# Patient Record
Sex: Male | Born: 1935 | Race: White | Hispanic: No | Marital: Married | Smoking: Never smoker
Health system: Southern US, Community
[De-identification: ages and names within clinical notes are randomized; demographics above are authoritative.]

## PROBLEM LIST (undated history)

## (undated) DIAGNOSIS — I639 Cerebral infarction, unspecified: Secondary | ICD-10-CM

## (undated) DIAGNOSIS — Z87442 Personal history of urinary calculi: Secondary | ICD-10-CM

## (undated) DIAGNOSIS — I10 Essential (primary) hypertension: Secondary | ICD-10-CM

## (undated) DIAGNOSIS — F419 Anxiety disorder, unspecified: Secondary | ICD-10-CM

## (undated) DIAGNOSIS — J189 Pneumonia, unspecified organism: Secondary | ICD-10-CM

## (undated) DIAGNOSIS — G4730 Sleep apnea, unspecified: Secondary | ICD-10-CM

## (undated) DIAGNOSIS — M1990 Unspecified osteoarthritis, unspecified site: Secondary | ICD-10-CM

## (undated) HISTORY — PX: OTHER SURGICAL HISTORY: SHX169

---

## 2012-03-29 DIAGNOSIS — R55 Syncope and collapse: Secondary | ICD-10-CM

## 2016-10-12 ENCOUNTER — Other Ambulatory Visit: Payer: Self-pay | Admitting: Urology

## 2016-10-13 ENCOUNTER — Other Ambulatory Visit: Payer: Self-pay | Admitting: Urology

## 2016-10-13 DIAGNOSIS — N200 Calculus of kidney: Secondary | ICD-10-CM

## 2016-11-11 ENCOUNTER — Inpatient Hospital Stay (HOSPITAL_COMMUNITY): Admission: RE | Admit: 2016-11-11 | Payer: Medicare Other | Source: Ambulatory Visit

## 2016-11-15 ENCOUNTER — Other Ambulatory Visit: Payer: Self-pay | Admitting: Radiology

## 2016-11-15 NOTE — Patient Instructions (Signed)
Howard Cabrera  11/15/2016   Your procedure is scheduled on: 11/18/2016    Report to [facility]  Entrance [place]  elevators to 3rd floor to  Short Stay Center at   0730 AM.  Call this number if you have problems the morning of surgery [phone]   Remember: ONLY 1 PERSON MAY GO WITH YOU TO SHORT STAY TO GET  READY MORNING OF YOUR SURGERY.  Do not eat food or drink liquids :After Midnight.     Take these medicines the morning of surgery with A SIP OF WATER: Diltiazem ( Dilacor)                                You may not have any metal on your body including hair pins and              piercings  Do not wear jewelry,  lotions, powders or perfumes, deodorant                   Men may shave face and neck.   Do not bring valuables to the hospital. [facility] IS NOT             RESPONSIBLE   FOR VALUABLES.  Contacts, dentures or bridgework may not be worn into surgery.  Leave suitcase in the car. After surgery it may be brought to your room.                   Please read over the following fact sheets you were given: _____________________________________________________________________             [facility] - Preparing for Surgery Before surgery, you can play an important role.  Because skin is not sterile, your skin needs to be as free of germs as possible.  You can reduce the number of germs on your skin by washing with CHG (chlorahexidine gluconate) soap before surgery.  CHG is an antiseptic cleaner which kills germs and bonds with the skin to continue killing germs even after washing. Please DO NOT use if you have an allergy to CHG or antibacterial soaps.  If your skin becomes reddened/irritated stop using the CHG and inform your nurse when you arrive at Short Stay. Do not shave (including legs and underarms) for at least 48 hours prior to the first CHG shower.  You may shave your face/neck. Please follow these instructions carefully:  1.   Shower with CHG Soap the night before surgery and the  morning of Surgery.  2.  If you choose to wash your hair, wash your hair first as usual with your  normal  shampoo.  3.  After you shampoo, rinse your hair and body thoroughly to remove the  shampoo.                           4.  Use CHG as you would any other liquid soap.  You can apply chg directly  to the skin and wash                       Gently with a scrungie or clean washcloth.  5.  Apply the CHG Soap to your body ONLY FROM THE NECK DOWN.   Do not use on face/ open  Wound or open sores. Avoid contact with eyes, ears mouth and genitals (private parts).                       Wash face,  Genitals (private parts) with your normal soap.             6.  Wash thoroughly, paying special attention to the area where your surgery  will be performed.  7.  Thoroughly rinse your body with warm water from the neck down.  8.  DO NOT shower/wash with your normal soap after using and rinsing off  the CHG Soap.                9.  Pat yourself dry with a clean towel.            10.  Wear clean pajamas.            11.  Place clean sheets on your bed the night of your first shower and do not  sleep with pets. Day of Surgery : Do not apply any lotions/deodorants the morning of surgery.  Please wear clean clothes to the hospital/surgery center.  FAILURE TO FOLLOW THESE INSTRUCTIONS MAY RESULT IN THE CANCELLATION OF YOUR SURGERY PATIENT SIGNATURE_________________________________  NURSE SIGNATURE__________________________________  ________________________________________________________________________  WHAT IS A BLOOD TRANSFUSION? Blood Transfusion Information  A transfusion is the replacement of blood or some of its parts. Blood is made up of multiple cells which provide different functions.  Red blood cells carry oxygen and are used for blood loss replacement.  White blood cells fight against infection.  Platelets control  bleeding.  Plasma helps clot blood.  Other blood products are available for specialized needs, such as hemophilia or other clotting disorders. BEFORE THE TRANSFUSION  Who gives blood for transfusions?   Healthy volunteers who are fully evaluated to make sure their blood is safe. This is blood bank blood. Transfusion therapy is the safest it has ever been in the practice of medicine. Before blood is taken from a donor, a complete history is taken to make sure that person has no history of diseases nor engages in risky social behavior (examples are intravenous drug use or sexual activity with multiple partners). The donor's travel history is screened to minimize risk of transmitting infections, such as malaria. The donated blood is tested for signs of infectious diseases, such as HIV and hepatitis. The blood is then tested to be sure it is compatible with you in order to minimize the chance of a transfusion reaction. If you or a relative donates blood, this is often done in anticipation of surgery and is not appropriate for emergency situations. It takes many days to process the donated blood. RISKS AND COMPLICATIONS Although transfusion therapy is very safe and saves many lives, the main dangers of transfusion include:   Getting an infectious disease.  Developing a transfusion reaction. This is an allergic reaction to something in the blood you were given. Every precaution is taken to prevent this. The decision to have a blood transfusion has been considered carefully by your caregiver before blood is given. Blood is not given unless the benefits outweigh the risks. AFTER THE TRANSFUSION  Right after receiving a blood transfusion, you will usually feel much better and more energetic. This is especially true if your red blood cells have gotten low (anemic). The transfusion raises the level of the red blood cells which carry oxygen, and this usually causes an energy increase.  The  nurse  administering the transfusion will monitor you carefully for complications. HOME CARE INSTRUCTIONS  No special instructions are needed after a transfusion. You may find your energy is better. Speak with your caregiver about any limitations on activity for underlying diseases you may have. SEEK MEDICAL CARE IF:   Your condition is not improving after your transfusion.  You develop redness or irritation at the intravenous (IV) site. SEEK IMMEDIATE MEDICAL CARE IF:  Any of the following symptoms occur over the next 12 hours:  Shaking chills.  You have a temperature by mouth above 102 F (38.9 C), not controlled by medicine.  Chest, back, or muscle pain.  People around you feel you are not acting correctly or are confused.  Shortness of breath or difficulty breathing.  Dizziness and fainting.  You get a rash or develop hives.  You have a decrease in urine output.  Your urine turns a dark color or changes to pink, red, or brown. Any of the following symptoms occur over the next 10 days:  You have a temperature by mouth above 102 F (38.9 C), not controlled by medicine.  Shortness of breath.  Weakness after normal activity.  The white part of the eye turns yellow (jaundice).  You have a decrease in the amount of urine or are urinating less often.  Your urine turns a dark color or changes to pink, red, or brown. Document Released: 10/22/2000 Document Revised: 01/17/2012 Document Reviewed: 06/10/2008 [facility] Patient Information 2014 [place], [place].  _______________________________________________________________________

## 2016-11-16 ENCOUNTER — Encounter (HOSPITAL_COMMUNITY)
Admission: RE | Admit: 2016-11-16 | Discharge: 2016-11-16 | Disposition: A | Discharge status: Home / Self Care | Payer: Medicare Other | Source: Ambulatory Visit | Attending: Urology | Admitting: Urology

## 2016-11-16 ENCOUNTER — Ambulatory Visit (HOSPITAL_COMMUNITY)
Admission: RE | Admit: 2016-11-16 | Discharge: 2016-11-16 | Disposition: A | Discharge status: Home / Self Care | Payer: Medicare Other | Source: Ambulatory Visit | Attending: Anesthesiology | Admitting: Anesthesiology

## 2016-11-16 ENCOUNTER — Encounter (HOSPITAL_COMMUNITY): Payer: Self-pay

## 2016-11-16 DIAGNOSIS — N200 Calculus of kidney: Secondary | ICD-10-CM | POA: Insufficient documentation

## 2016-11-16 DIAGNOSIS — J9811 Atelectasis: Secondary | ICD-10-CM | POA: Diagnosis not present

## 2016-11-16 DIAGNOSIS — Z01818 Encounter for other preprocedural examination: Secondary | ICD-10-CM

## 2016-11-16 HISTORY — DX: Sleep apnea, unspecified: G47.30

## 2016-11-16 HISTORY — DX: Unspecified osteoarthritis, unspecified site: M19.90

## 2016-11-16 HISTORY — DX: Pneumonia, unspecified organism: J18.9

## 2016-11-16 HISTORY — DX: Anxiety disorder, unspecified: F41.9

## 2016-11-16 HISTORY — DX: Personal history of urinary calculi: Z87.442

## 2016-11-16 HISTORY — DX: Cerebral infarction, unspecified: I63.9

## 2016-11-16 HISTORY — DX: Essential (primary) hypertension: I10

## 2016-11-16 LAB — BASIC METABOLIC PANEL
Anion gap: 8 (ref 5–15)
BUN: 14 mg/dL (ref 6–20)
CHLORIDE: 104 mmol/L (ref 101–111)
CO2: 27 mmol/L (ref 22–32)
CREATININE: 1.17 mg/dL (ref 0.61–1.24)
Calcium: 9.3 mg/dL (ref 8.9–10.3)
GFR calc Af Amer: >60 mL/min (ref >60)
GFR calc non Af Amer: 56 mL/min — ABNORMAL LOW (ref >60)
Glucose, Bld: 100 mg/dL — ABNORMAL HIGH (ref 65–99)
Potassium: 3.5 mmol/L (ref 3.5–5.1)
SODIUM: 139 mmol/L (ref 135–145)

## 2016-11-16 LAB — PROTIME-INR
INR: 1
Prothrombin Time: 13.2 seconds (ref 11.4–15.2)

## 2016-11-16 LAB — CBC
HCT: 48.6 % (ref 39.0–52.0)
Hemoglobin: 17.3 g/dL — ABNORMAL HIGH (ref 13.0–17.0)
MCH: 34.9 pg — AB (ref 26.0–34.0)
MCHC: 35.6 g/dL (ref 30.0–36.0)
MCV: 98 fL (ref 78.0–100.0)
PLATELETS: 214 10*3/uL (ref 150–400)
RBC: 4.96 MIL/uL (ref 4.22–5.81)
RDW: 13 % (ref 11.5–15.5)
WBC: 6 10*3/uL (ref 4.0–10.5)

## 2016-11-16 LAB — ABO/RH: ABO/RH(D): O POS

## 2016-11-16 NOTE — Final Progress Note (Signed)
Final CXR done 11/16/16 results shown to anesthesia ( Dr Noreene LarssonJoslin) .  No new orders given.

## 2016-11-16 NOTE — Progress Notes (Signed)
Final EKG 11/16/16 in epic, CXR done 11/16/16 routed via epic to Dr. Retta Dionesahlstedt

## 2016-11-17 NOTE — H&P (Signed)
Urology History and Physical Exam  CC: Left kidney stone  HPI: 83 year old male originally sent here by Dr. Nechama GuardBauer in [place] for evaluation and management of a branched left renal calculus. He had a CT of the chest abdomen and pelvis during a recent hospitalization for pneumonia. He was found to have asymptomatic right renal calyceal and a left staghorn stone. The right renal stone was recently treated with lithotripsy. He tolerated this procedure well. There is a remaining 26 mm branch left renal stone.   The patient has been prepared by Dr. Nechama GuardBauer to have a possible percutaneous nephrolithotomy. He has been instructed in the procedure, risks and complications.  He is not having any significant gross hematuria or white pain. He does have a long-standing history of urolithiasis. His urologist in the past has been Dr. Ian BushmanHurt in [place].     PMH: Past Medical History:  Diagnosis Date  . Anxiety   . Arthritis   . History of kidney stones   . Hypertension   . Pneumonia    08/2016  . Sleep apnea    cpap  . Stroke [facility](HCC)    mild stroke pt. state" just cant remember"    PSH: Past Surgical History:  Procedure Laterality Date  . Left CEA    . Left thumb surgery      Allergies: Allergies  Allergen Reactions  . Penicillins Rash    Has patient had a PCN reaction causing immediate rash, facial/tongue/throat swelling, SOB or lightheadedness with hypotension: Yes Has patient had a PCN reaction causing severe rash involving mucus membranes or skin necrosis: No Has patient had a PCN reaction that required hospitalization No Has patient had a PCN reaction occurring within the last 10 years: No If all of the above answers are "NO", then may proceed with Cephalosporin use.     Medications: No prescriptions prior to admission.     Social History: Social History   Social History  . Marital status: Married    Spouse name: N/A  . Number of children: N/A  . Years of  education: N/A   Occupational History  . Not on file.   Social History Main Topics  . Smoking status: Never Smoker  . Smokeless tobacco: Never Used  . Alcohol use No  . Drug use: No  . Sexual activity: Not Currently   Other Topics Concern  . Not on file   Social History Narrative  . No narrative on file    Family History: No family history on file.  Review of Systems: GU Review Male: Patient denies frequent urination, hard to postpone urination, burning/ pain with urination, get up at night to urinate, leakage of urine, stream starts and stops, trouble starting your stream, have to strain to urinate , erection problems, and penile pain. Gastrointestinal (Upper): Patient denies nausea, vomiting, and indigestion/ heartburn. Gastrointestinal (Lower): Patient denies constipation and diarrhea. Constitutional: Patient denies fever, night sweats, weight loss, and fatigue. Skin: Patient denies skin rash/ lesion and itching. Eyes: Patient denies blurred vision and double vision. Ears/ Nose/ Throat: Patient denies sore throat and sinus problems. Hematologic/Lymphatic: Patient reports easy bruising. Patient denies swollen glands. Cardiovascular: Patient denies leg swelling and chest pains. Respiratory: Patient denies cough and shortness of breath. Endocrine: Patient denies excessive thirst. Musculoskeletal: Patient denies back pain and joint pain. Neurological: Patient denies headaches and dizziness. Psychologic: Patient denies depression and anxiety.                   Physical  Exam: @VITALS2 @ Constitutional: Well-nourished. No physical deformities. Normally developed. Good grooming. Neck: Neck symmetrical, not swollen. Normal tracheal position. Respiratory: No labored breathing, no use of accessory muscles. Skin: No paleness, no jaundice, no cyanosis. No lesion, no ulcer, no rash. Neurologic / Psychiatric: Oriented to time, oriented to place, oriented to person. No depression, no anxiety, no  agitation. Eyes: Normal conjunctivae. Normal eyelids. Ears, Nose, Mouth, and Throat: Left ear no scars, no lesions, no masses. Right ear no scars, no lesions, no masses. Nose no scars, no lesions, no masses. Normal hearing. Normal lips. Musculoskeletal: Normal gait and station of head and neck.   Studies:  Recent Labs     11/16/16  1343  HGB  17.3*  WBC  6.0  PLT  214    Recent Labs     11/16/16  1343  NA  139  K  3.5  CL  104  CO2  27  BUN  14  CREATININE  1.17  CALCIUM  9.3  GFRNONAA  56*  GFRAA  >60     Recent Labs     11/16/16  1400  INR  1.00     Invalid input(s): ABG    Assessment:  26 mm branched left renal stone  Plan: Left PCNL. I have discussed the procedure as well as risks and complications (bleeding/transfusion need, infection, anesthetic complications, death, 2nd procedure, among others) and the patient desires to proceed

## 2016-11-17 NOTE — Progress Notes (Signed)
Patient's wife aware to go to Radiology first and check in on 11/18/2016 then go to Short Stay. Wife voiced understanding   Spoke with wife at 11/17/16 at 1615pm.

## 2016-11-18 ENCOUNTER — Encounter (HOSPITAL_COMMUNITY): Payer: Self-pay

## 2016-11-18 ENCOUNTER — Ambulatory Visit (HOSPITAL_COMMUNITY): Payer: Medicare Other | Admitting: Anesthesiology

## 2016-11-18 ENCOUNTER — Observation Stay (HOSPITAL_COMMUNITY): Payer: Medicare Other

## 2016-11-18 ENCOUNTER — Ambulatory Visit (HOSPITAL_COMMUNITY)
Admission: RE | Admit: 2016-11-18 | Discharge: 2016-11-18 | Disposition: A | Discharge status: Home / Self Care | Payer: Medicare Other | Source: Ambulatory Visit | Attending: Urology | Admitting: Urology

## 2016-11-18 ENCOUNTER — Encounter (HOSPITAL_COMMUNITY)
Admission: RE | Disposition: A | Discharge status: Home / Self Care | Payer: Self-pay | Source: Ambulatory Visit | Attending: Urology

## 2016-11-18 ENCOUNTER — Observation Stay (HOSPITAL_COMMUNITY)
Admission: RE | Admit: 2016-11-18 | Discharge: 2016-11-19 | Disposition: A | Discharge status: Home / Self Care | Payer: Medicare Other | Source: Ambulatory Visit | Attending: Urology | Admitting: Urology

## 2016-11-18 DIAGNOSIS — Z7982 Long term (current) use of aspirin: Secondary | ICD-10-CM | POA: Diagnosis not present

## 2016-11-18 DIAGNOSIS — I10 Essential (primary) hypertension: Secondary | ICD-10-CM | POA: Diagnosis not present

## 2016-11-18 DIAGNOSIS — Z79899 Other long term (current) drug therapy: Secondary | ICD-10-CM | POA: Diagnosis not present

## 2016-11-18 DIAGNOSIS — G4730 Sleep apnea, unspecified: Secondary | ICD-10-CM | POA: Insufficient documentation

## 2016-11-18 DIAGNOSIS — Z9989 Dependence on other enabling machines and devices: Secondary | ICD-10-CM | POA: Insufficient documentation

## 2016-11-18 DIAGNOSIS — Z8673 Personal history of transient ischemic attack (TIA), and cerebral infarction without residual deficits: Secondary | ICD-10-CM | POA: Diagnosis not present

## 2016-11-18 DIAGNOSIS — N200 Calculus of kidney: Principal | ICD-10-CM

## 2016-11-18 HISTORY — PX: IR GENERIC HISTORICAL: IMG1180011

## 2016-11-18 HISTORY — PX: NEPHROLITHOTOMY: SHX5134

## 2016-11-18 LAB — TYPE AND SCREEN
ABO/RH(D): O POS
ANTIBODY SCREEN: NEGATIVE

## 2016-11-18 SURGERY — NEPHROLITHOTOMY PERCUTANEOUS
Anesthesia: General | Laterality: Left

## 2016-11-18 MED ORDER — SUCCINYLCHOLINE CHLORIDE 200 MG/10ML IV SOSY
PREFILLED_SYRINGE | INTRAVENOUS | Status: AC
  Filled 2016-11-18: qty 10

## 2016-11-18 MED ORDER — IOPAMIDOL (ISOVUE-300) INJECTION 61%
INTRAVENOUS | Status: AC
  Filled 2016-11-18: qty 50

## 2016-11-18 MED ORDER — DEXAMETHASONE SODIUM PHOSPHATE 10 MG/ML IJ SOLN
INTRAMUSCULAR | Status: AC
  Filled 2016-11-18: qty 1

## 2016-11-18 MED ORDER — LIDOCAINE HCL 1 % IJ SOLN
INTRAMUSCULAR | Status: AC
  Filled 2016-11-18: qty 20

## 2016-11-18 MED ORDER — SODIUM CHLORIDE 0.9 % IR SOLN
Status: DC | PRN
  Administered 2016-11-18: 12000 mL

## 2016-11-18 MED ORDER — DIPHENHYDRAMINE HCL 25 MG PO CAPS: 25.0000 mg | ORAL_CAPSULE | Freq: Every day | ORAL | Status: DC | PRN

## 2016-11-18 MED ORDER — SIMVASTATIN 40 MG PO TABS: 40.0000 mg | ORAL_TABLET | Freq: Every evening | ORAL | Status: DC

## 2016-11-18 MED ORDER — DILTIAZEM HCL ER 240 MG PO CP24
240.0000 mg | ORAL_CAPSULE | Freq: Every day | ORAL | Status: DC
  Administered 2016-11-19: 240 mg via ORAL
  Filled 2016-11-18 (×2): qty 1

## 2016-11-18 MED ORDER — ROCURONIUM BROMIDE 50 MG/5ML IV SOSY
PREFILLED_SYRINGE | INTRAVENOUS | Status: AC
  Filled 2016-11-18: qty 5

## 2016-11-18 MED ORDER — SUGAMMADEX SODIUM 200 MG/2ML IV SOLN
INTRAVENOUS | Status: DC | PRN
  Administered 2016-11-18: 200 mg via INTRAVENOUS

## 2016-11-18 MED ORDER — IOPAMIDOL (ISOVUE-300) INJECTION 61%
INTRAVENOUS | Status: AC
  Administered 2016-11-18: 60 mL
  Filled 2016-11-18: qty 50

## 2016-11-18 MED ORDER — ONDANSETRON HCL 4 MG/2ML IJ SOLN
INTRAMUSCULAR | Status: AC
  Filled 2016-11-18: qty 2

## 2016-11-18 MED ORDER — OXYBUTYNIN CHLORIDE 5 MG PO TABS: 5.0000 mg | ORAL_TABLET | Freq: Three times a day (TID) | ORAL | Status: DC | PRN

## 2016-11-18 MED ORDER — ROSUVASTATIN CALCIUM 10 MG PO TABS
10.0000 mg | ORAL_TABLET | Freq: Every day | ORAL | Status: DC
  Administered 2016-11-18: 10 mg via ORAL
  Filled 2016-11-18: qty 1

## 2016-11-18 MED ORDER — SODIUM CHLORIDE 0.9 % IV SOLN
INTRAVENOUS | Status: DC
Start: 2016-11-18 — End: 2016-11-18
  Administered 2016-11-18: 08:00:00 via INTRAVENOUS

## 2016-11-18 MED ORDER — CIPROFLOXACIN IN D5W 400 MG/200ML IV SOLN
400.0000 mg | INTRAVENOUS | Status: AC
Start: 2016-11-18 — End: 2016-11-18
  Administered 2016-11-18: 400 mg via INTRAVENOUS
  Filled 2016-11-18: qty 200

## 2016-11-18 MED ORDER — DEXAMETHASONE SODIUM PHOSPHATE 10 MG/ML IJ SOLN
INTRAMUSCULAR | Status: DC | PRN
  Administered 2016-11-18: 10 mg via INTRAVENOUS

## 2016-11-18 MED ORDER — FENTANYL CITRATE (PF) 100 MCG/2ML IJ SOLN
INTRAMUSCULAR | Status: AC
  Filled 2016-11-18: qty 2

## 2016-11-18 MED ORDER — MEPERIDINE HCL 50 MG/ML IJ SOLN: 6.2500 mg | INTRAMUSCULAR | Status: DC | PRN

## 2016-11-18 MED ORDER — LIDOCAINE HCL 1 % IJ SOLN
INTRAMUSCULAR | Status: AC | PRN
  Administered 2016-11-18: 10 mL

## 2016-11-18 MED ORDER — SODIUM CHLORIDE 0.45 % IV SOLN
INTRAVENOUS | Status: DC
  Administered 2016-11-18: 18:00:00 via INTRAVENOUS

## 2016-11-18 MED ORDER — SULFAMETHOXAZOLE-TRIMETHOPRIM 800-160 MG PO TABS: 1.0000 | ORAL_TABLET | Freq: Two times a day (BID) | ORAL | 0 refills | Status: AC

## 2016-11-18 MED ORDER — LIDOCAINE 2% (20 MG/ML) 5 ML SYRINGE
INTRAMUSCULAR | Status: AC
  Filled 2016-11-18: qty 5

## 2016-11-18 MED ORDER — PHENYLEPHRINE HCL 10 MG/ML IJ SOLN
INTRAMUSCULAR | Status: AC
  Filled 2016-11-18: qty 1

## 2016-11-18 MED ORDER — SULFAMETHOXAZOLE-TRIMETHOPRIM 800-160 MG PO TABS
1.0000 | ORAL_TABLET | Freq: Two times a day (BID) | ORAL | Status: DC
  Administered 2016-11-18 – 2016-11-19 (×2): 1 via ORAL
  Filled 2016-11-18 (×3): qty 1

## 2016-11-18 MED ORDER — NALOXONE HCL 0.4 MG/ML IJ SOLN
INTRAMUSCULAR | Status: AC
  Filled 2016-11-18: qty 1

## 2016-11-18 MED ORDER — SUCCINYLCHOLINE CHLORIDE 200 MG/10ML IV SOSY
PREFILLED_SYRINGE | INTRAVENOUS | Status: DC | PRN
  Administered 2016-11-18: 120 mg via INTRAVENOUS

## 2016-11-18 MED ORDER — FENTANYL CITRATE (PF) 100 MCG/2ML IJ SOLN
25.0000 ug | INTRAMUSCULAR | Status: DC | PRN
  Administered 2016-11-18: 25 ug via INTRAVENOUS
  Administered 2016-11-18: 50 ug via INTRAVENOUS

## 2016-11-18 MED ORDER — ONDANSETRON HCL 4 MG/2ML IJ SOLN
INTRAMUSCULAR | Status: DC | PRN
Start: 2016-11-18 — End: 2016-11-18
  Administered 2016-11-18: 4 mg via INTRAVENOUS

## 2016-11-18 MED ORDER — PROPOFOL 10 MG/ML IV BOLUS
INTRAVENOUS | Status: DC | PRN
  Administered 2016-11-18: 140 mg via INTRAVENOUS

## 2016-11-18 MED ORDER — ACETAMINOPHEN 325 MG PO TABS: 650.0000 mg | ORAL_TABLET | ORAL | Status: DC | PRN

## 2016-11-18 MED ORDER — PROPOFOL 10 MG/ML IV BOLUS
INTRAVENOUS | Status: AC
  Filled 2016-11-18: qty 20

## 2016-11-18 MED ORDER — LACTATED RINGERS IV SOLN
INTRAVENOUS | Status: DC | PRN
  Administered 2016-11-18: 13:00:00 via INTRAVENOUS

## 2016-11-18 MED ORDER — OXYCODONE HCL 5 MG PO TABS
5.0000 mg | ORAL_TABLET | ORAL | Status: DC | PRN
  Administered 2016-11-18: 5 mg via ORAL
  Filled 2016-11-18: qty 1

## 2016-11-18 MED ORDER — SENNA 8.6 MG PO TABS
1.0000 | ORAL_TABLET | Freq: Two times a day (BID) | ORAL | Status: DC
  Administered 2016-11-18 – 2016-11-19 (×2): 8.6 mg via ORAL
  Filled 2016-11-18 (×2): qty 1

## 2016-11-18 MED ORDER — MIDAZOLAM HCL 2 MG/2ML IJ SOLN
INTRAMUSCULAR | Status: AC | PRN
  Administered 2016-11-18 (×2): .25 mg via INTRAVENOUS
  Administered 2016-11-18: 0.5 mg via INTRAVENOUS
  Administered 2016-11-18: 1 mg via INTRAVENOUS

## 2016-11-18 MED ORDER — FENTANYL CITRATE (PF) 100 MCG/2ML IJ SOLN
INTRAMUSCULAR | Status: AC | PRN
  Administered 2016-11-18: 25 ug via INTRAVENOUS
  Administered 2016-11-18 (×2): 12.5 ug via INTRAVENOUS
  Administered 2016-11-18: 50 ug via INTRAVENOUS

## 2016-11-18 MED ORDER — PHENYLEPHRINE 40 MCG/ML (10ML) SYRINGE FOR IV PUSH (FOR BLOOD PRESSURE SUPPORT)
PREFILLED_SYRINGE | INTRAVENOUS | Status: DC | PRN
  Administered 2016-11-18 (×5): 80 ug via INTRAVENOUS

## 2016-11-18 MED ORDER — FLUMAZENIL 0.5 MG/5ML IV SOLN
INTRAVENOUS | Status: AC
  Filled 2016-11-18: qty 5

## 2016-11-18 MED ORDER — LIDOCAINE 2% (20 MG/ML) 5 ML SYRINGE
INTRAMUSCULAR | Status: DC | PRN
  Administered 2016-11-18: 100 mg via INTRAVENOUS

## 2016-11-18 MED ORDER — SUGAMMADEX SODIUM 200 MG/2ML IV SOLN
INTRAVENOUS | Status: AC
  Filled 2016-11-18: qty 2

## 2016-11-18 MED ORDER — FENTANYL CITRATE (PF) 100 MCG/2ML IJ SOLN
INTRAMUSCULAR | Status: AC
  Filled 2016-11-18: qty 6

## 2016-11-18 MED ORDER — IOHEXOL 300 MG/ML  SOLN
INTRAMUSCULAR | Status: DC | PRN
  Administered 2016-11-18: 24 mL

## 2016-11-18 MED ORDER — CIPROFLOXACIN IN D5W 400 MG/200ML IV SOLN: 400.0000 mg | INTRAVENOUS | Status: DC

## 2016-11-18 MED ORDER — SODIUM CHLORIDE 0.9 % IV SOLN
INTRAVENOUS | Status: DC | PRN
  Administered 2016-11-18: 25 ug/min via INTRAVENOUS

## 2016-11-18 MED ORDER — MIDAZOLAM HCL 2 MG/2ML IJ SOLN
INTRAMUSCULAR | Status: AC
  Filled 2016-11-18: qty 6

## 2016-11-18 MED ORDER — ROCURONIUM BROMIDE 10 MG/ML (PF) SYRINGE
PREFILLED_SYRINGE | INTRAVENOUS | Status: DC | PRN
  Administered 2016-11-18: 40 mg via INTRAVENOUS

## 2016-11-18 MED ORDER — IOPAMIDOL (ISOVUE-300) INJECTION 61%
25.0000 mL | Freq: Once | INTRAVENOUS | Status: AC | PRN
  Administered 2016-11-18: 60 mL

## 2016-11-18 MED ORDER — HYDROCHLOROTHIAZIDE 25 MG PO TABS
12.5000 mg | ORAL_TABLET | Freq: Every day | ORAL | Status: DC
  Administered 2016-11-18 – 2016-11-19 (×2): 12.5 mg via ORAL
  Filled 2016-11-18 (×2): qty 1

## 2016-11-18 MED ORDER — ONDANSETRON HCL 4 MG/2ML IJ SOLN: 4.0000 mg | Freq: Once | INTRAMUSCULAR | Status: DC | PRN

## 2016-11-18 SURGICAL SUPPLY — 46 items
BAG URINE DRAINAGE (UROLOGICAL SUPPLIES) ×3 IMPLANT
BASKET ZERO TIP NITINOL 2.4FR (BASKET) ×3 IMPLANT
BENZOIN TINCTURE PRP APPL 2/3 (GAUZE/BANDAGES/DRESSINGS) ×3 IMPLANT
BLADE SURG 15 STRL LF DISP TIS (BLADE) ×1 IMPLANT
BLADE SURG 15 STRL SS (BLADE) ×2
CARTRIDGE STONEBREAK CO2 KIDNE (ELECTROSURGICAL) IMPLANT
CATH FOLEY 2W COUNCIL 20FR 5CC (CATHETERS) IMPLANT
CATH MULTI PURPOSE 16FR DRAIN (STENTS) ×3 IMPLANT
CATH ROBINSON RED A/P 20FR (CATHETERS) IMPLANT
CATH X-FORCE N30 NEPHROSTOMY (TUBING) ×3 IMPLANT
COVER SURGICAL LIGHT HANDLE (MISCELLANEOUS) ×3 IMPLANT
DRAPE C-ARM 42X120 X-RAY (DRAPES) ×3 IMPLANT
DRAPE LINGEMAN PERC (DRAPES) ×3 IMPLANT
DRAPE SURG IRRIG POUCH 19X23 (DRAPES) ×3 IMPLANT
DRSG PAD ABDOMINAL 8X10 ST (GAUZE/BANDAGES/DRESSINGS) ×3 IMPLANT
DRSG TEGADERM 8X12 (GAUZE/BANDAGES/DRESSINGS) ×3 IMPLANT
FIBER LASER FLEXIVA 1000 (UROLOGICAL SUPPLIES) IMPLANT
FIBER LASER FLEXIVA 365 (UROLOGICAL SUPPLIES) IMPLANT
FIBER LASER FLEXIVA 550 (UROLOGICAL SUPPLIES) IMPLANT
FIBER LASER TRAC TIP (UROLOGICAL SUPPLIES) IMPLANT
GAUZE SPONGE 4X4 12PLY STRL (GAUZE/BANDAGES/DRESSINGS) ×3 IMPLANT
GLOVE BIOGEL M 8.0 STRL (GLOVE) ×3 IMPLANT
GOWN STRL REUS W/TWL XL LVL3 (GOWN DISPOSABLE) ×3 IMPLANT
GUIDEWIRE AMPLAZ .035X145 (WIRE) ×6 IMPLANT
KIT BASIN OR (CUSTOM PROCEDURE TRAY) ×3 IMPLANT
MANIFOLD NEPTUNE II (INSTRUMENTS) ×3 IMPLANT
NS IRRIG 1000ML POUR BTL (IV SOLUTION) ×3 IMPLANT
PACK CYSTO (CUSTOM PROCEDURE TRAY) ×3 IMPLANT
PROBE KIDNEY STONEBRKR 2.0X425 (ELECTROSURGICAL) IMPLANT
PROBE LITHOCLAST ULTRA 3.8X403 (UROLOGICAL SUPPLIES) ×3 IMPLANT
PROBE PNEUMATIC 1.0MMX570MM (UROLOGICAL SUPPLIES) ×3 IMPLANT
SET IRRIG Y TYPE TUR BLADDER L (SET/KITS/TRAYS/PACK) ×3 IMPLANT
SHEATH PEELAWAY SET 9 (SHEATH) ×3 IMPLANT
SPONGE LAP 4X18 X RAY DECT (DISPOSABLE) ×3 IMPLANT
STENT CONTOUR 6FRX24X.038 (STENTS) ×3 IMPLANT
STONE CATCHER W/TUBE ADAPTER (UROLOGICAL SUPPLIES) ×3 IMPLANT
SUT SILK 2 0 30  PSL (SUTURE) ×2
SUT SILK 2 0 30 PSL (SUTURE) ×1 IMPLANT
SYR 10ML LL (SYRINGE) ×3 IMPLANT
SYR 20CC LL (SYRINGE) ×6 IMPLANT
TRAY FOLEY BAG SILVER LF 14FR (CATHETERS) IMPLANT
TRAY FOLEY W/METER SILVER 16FR (SET/KITS/TRAYS/PACK) ×3 IMPLANT
TUBE CONNECTING VINYL 14FR 30C (MISCELLANEOUS) ×3 IMPLANT
TUBING CONNECTING 10 (TUBING) ×4 IMPLANT
TUBING CONNECTING 10' (TUBING) ×2
WATER STERILE IRR 1500ML POUR (IV SOLUTION) IMPLANT

## 2016-11-18 NOTE — Anesthesia Procedure Notes (Signed)
Procedure Name: Intubation Date/Time: 11/18/2016 12:03 PM Performed by: Lind Covert Pre-anesthesia Checklist: Patient identified, Timeout performed, Emergency Drugs available, Suction available and Patient being monitored Patient Re-evaluated:Patient Re-evaluated prior to inductionOxygen Delivery Method: Circle system utilized Preoxygenation: Pre-oxygenation with 100% oxygen Intubation Type: IV induction Laryngoscope Size: Mac and 4 Grade View: Grade II Tube type: Oral Tube size: 7.5 mm Number of attempts: 1 Airway Equipment and Method: Stylet Placement Confirmation: ETT inserted through vocal cords under direct vision,  positive ETCO2 and breath sounds checked- equal and bilateral Secured at: 22 cm Tube secured with: Tape Dental Injury: Teeth and Oropharynx as per pre-operative assessment

## 2016-11-18 NOTE — Consult Note (Signed)
Chief Complaint: Patient was seen in consultation today for left nephroureteral catheter placement  Referring Physician(s): Dahlstedt,Stephen  Supervising Physician: Irish LackYamagata, Glenn  [facility]  History of Present Illness: Howard Cabrera is a 83 y.o. male with known history of nephrolithiasis and recent right renal stone lithotripsy.He also has a 2.6 cm left staghorn calculus. He presents today for left nephroureteral catheter placement prior to planned nephrolithotomy.  Past Medical History:  Diagnosis Date  . Anxiety   . Arthritis   . History of kidney stones   . Hypertension   . Pneumonia    08/2016  . Sleep apnea    cpap  . Stroke [facility](HCC)    mild stroke pt. state" just cant remember"    Past Surgical History:  Procedure Laterality Date  . Left CEA    . Left thumb surgery      Allergies: Penicillins  Medications: Prior to Admission medications   Medication Sig Start Date End Date Taking? Authorizing Provider  acetaminophen (TYLENOL) 650 MG CR tablet Take 650 mg by mouth every 8 (eight) hours as needed for pain.    Historical Provider, MD  Ascorbic Acid (VITAMIN C PO) Take 1 tablet by mouth daily.    Historical Provider, MD  aspirin EC 81 MG tablet Take 81 mg by mouth daily.    Historical Provider, MD  diltiazem (DILACOR XR) 240 MG 24 hr capsule Take 240 mg by mouth daily.    Historical Provider, MD  diphenhydrAMINE (BENADRYL) 25 mg capsule Take 25 mg by mouth daily as needed for itching or allergies.    Historical Provider, MD  hydrochlorothiazide (HYDRODIURIL) 25 MG tablet Take 12.5 mg by mouth daily. 09/21/16   Historical Provider, MD  simvastatin (ZOCOR) 40 MG tablet Take 40 mg by mouth every evening. 09/21/16   Historical Provider, MD     No family history on file.  Social History   Social History  . Marital status: Married    Spouse name: N/A  . Number of children: N/A  . Years of education: N/A   Social History Main Topics  .  Smoking status: Never Smoker  . Smokeless tobacco: Never Used  . Alcohol use No  . Drug use: No  . Sexual activity: Not Currently   Other Topics Concern  . Not on file   Social History Narrative  . No narrative on file      Review of Systems patient denies fever, headache, chest pain, dyspnea, cough, abdominal/back pain, nausea, vomiting or abnormal bleeding; he does have easy bruising  Vital Signs: Blood pressure 162/82, heart rate 73, temp 97.7, respirations 18, O2 sat 96% room air    Physical Exam Awake, alert. Heart of hearing; chest with slightly diminished breath sounds right base, left clear;Heart with regular rate and rhythm. Abdomen soft, positive bowel sounds, nontender. No significant CVA tenderness. Lower extremities- no edema  Mallampati Score:     Imaging: Dg Chest 2 View  Result Date: 11/16/2016 CLINICAL DATA:  Kidney stone removal. EXAM: CHEST  2 VIEW COMPARISON:  No prior. FINDINGS: Mediastinum hilar structures are normal. Bibasilar pulmonary interstitial changes noted. Findings may be secondary chronic interstitial lung disease however active pneumonitis cannot be excluded. Mild basilar atelectasis and/or scarring. No pleural effusion or pneumothorax . IMPRESSION: 1. Basilar interstitial prominence noted bilaterally. Findings may be secondary to chronic interstitial lung disease. Active pneumonitis cannot be excluded. 2. Basilar subsegmental atelectasis and/or pleuroparenchymal scarring. Electronically Signed   By: Maisie Fushomas  Register  On: 11/16/2016 15:11    Labs:  CBC:  Recent Labs  11/16/16 1343  WBC 6.0  HGB 17.3*  HCT 48.6  PLT 214    COAGS:  Recent Labs  11/16/16 1400  INR 1.00    BMP:  Recent Labs  11/16/16 1343  NA 139  K 3.5  CL 104  CO2 27  GLUCOSE 100*  BUN 14  CALCIUM 9.3  CREATININE 1.17  GFRNONAA 56*  GFRAA >60    LIVER FUNCTION TESTS: No results for input(s): BILITOT, AST, ALT, ALKPHOS, PROT, ALBUMIN in the last  8760 hours.  TUMOR MARKERS: No results for input(s): AFPTM, CEA, CA199, CHROMGRNA in the last 8760 hours.  Assessment and Plan: 83 y.o. male with known history of nephrolithiasis and recent right renal stone lithotripsy.He also has a 2.6 cm left staghorn calculus. He presents today for left nephroureteral catheter placement prior to planned nephrolithotomy. Risks and benefits discussed with the patient/wife including, but not limited to infection, bleeding, significant bleeding causing loss or decrease in renal function or damage to adjacent structures.  All of the patient's questions were answered, patient is agreeable to proceed. Consent signed and in chart.     Thank you for this interesting consult.  I greatly enjoyed meeting Howard Cabrera and look forward to participating in their care.  A copy of this report was sent to the requesting provider on this date.  Electronically Signed: D. Jeananne Rama 11/18/2016, 8:46 AM   I spent a total of  20 minutes   in face to face in clinical consultation, greater than 50% of which was counseling/coordinating care for left nephroureteral catheter placement

## 2016-11-18 NOTE — Anesthesia Preprocedure Evaluation (Addendum)
Anesthesia Evaluation  Patient identified by MRN, date of birth, ID band Patient awake    Reviewed: Allergy & Precautions, H&P , Patient's Chart, lab work & pertinent test results, reviewed documented beta blocker date and time   Airway Mallampati: II  TM Distance: >3 FB Neck ROM: full    Dental no notable dental hx.    Pulmonary sleep apnea ,    Pulmonary exam normal breath sounds clear to auscultation       Cardiovascular hypertension, On Medications  Rhythm:regular Rate:Normal     Neuro/Psych    GI/Hepatic   Endo/Other    Renal/GU      Musculoskeletal   Abdominal   Peds  Hematology   Anesthesia Other Findings   Reproductive/Obstetrics                             Anesthesia Physical Anesthesia Plan  ASA: II  Anesthesia Plan: General   Post-op Pain Management:    Induction: Intravenous  Airway Management Planned: Oral ETT  Additional Equipment:   Intra-op Plan:   Post-operative Plan: Extubation in OR  Informed Consent: I have reviewed the patients History and Physical, chart, labs and discussed the procedure including the risks, benefits and alternatives for the proposed anesthesia with the patient or authorized representative who has indicated his/her understanding and acceptance.   Dental Advisory Given and Dental advisory given  Plan Discussed with: CRNA and Surgeon  Anesthesia Plan Comments: (  Discussed general anesthesia, including possible nausea, instrumentation of airway, sore throat,pulmonary aspiration, etc. I asked if the were any outstanding questions, or  concerns before we proceeded.)        Anesthesia Quick Evaluation

## 2016-11-18 NOTE — Op Note (Signed)
Preoperative diagnosis: 26 millimeter branched left renal stone, lower pole left renal calculi  Postoperative diagnosis: Same  Principal procedure: Left percutaneous nephrolithotomy of 26 millimeter branched stone, left antegrade nephrostogram with fluoroscopic interpretation, placement of 24 centimeter by 6 French contour double-J stent without tether  Surgeon: Maclovio Henson  Anesthesia: Gen. endotracheal  Specimen: Stone fragments, to the patient's family.  Drains: 16 French Foley catheter in the bladder, 6 French by 24 centimeter contour double-J stent and left ureter, 16 French nephrostomy tube.  Complications: Difficult percutaneous access by interventional radiology, with small renal pelvic tear seen endoscopically.  Estimated blood loss: 100 mL  Indications: 83 year old male with a history of urolithiasis.  He had a ureteral stone taken care of by Dr. Wendall St[place]Brad [place] in late [place].  He has a 26 millimeter branched left renal stone in the interpolar kidney, as well as lower pole left renal calculi.  Patient presents at this time for her cutaneous management of his left renal stone burden.  Risks and complications of percutaneous nephrolithotomy have been discussed with the patient at his previous office visit.  He understands these and desires to proceed.  Findings: At the introduction of the rigid nephroscope, there was a small tear in the renal pelvis laterally, located in the lower part of the renal pelvis proximal to the left UPJ.  The stone was easily identified in the interpolar kidney.  Access into the lower pole calyceal system was not possible.  Description of procedure: The patient was first taken to the interventional radiology suite where, after IV antibiotics, Dr. Fredia SorrowYamagata performed percutaneous access into the upper pole calyceal system.  He was then taken to the operating room/holding area.  The left surgical site was marked.  The patient was identified.  He was  then brought to the operating room where general endotracheal anesthetic was administered.  Bladder was drained with a 16 French Foley catheter.  The patient was placed in the prone position.  All pressure points and extremities were padded carefully.  The left flank was prepped and draped around the indwelling Kumpe catheter.  At this point, proper timeout was performed.  Through the Kumpe catheter, fluoroscopically, I guided a floppy tip superstiff guidewire into the bladder.  The Kumpe catheter was then removed.  The skin was then incised just lateral to the Kumpe catheter/guidewire.  Incision was approximately 12 millimeters.  I then, over top of the guidewire, passed a 10 French peel-away sheath.  The core was removed, and a second guidewire was advanced along the first guidewire into the bladder, fluoroscopically.  At this point, the sheath was removed.  I then negotiated the NephroMax balloon over the working guidewire, up to the point of the kidney stone which I saw fluoroscopically.  The balloon was then inflated to 18 atmospheres of pressure for approximately 2 minutes.  The nephrostomy access sheath was then advanced over top of the balloon.  This was advanced fluoroscopically to the tip of the balloon, the balloon was then deflated and removed over the working guidewire.  I then advanced the nephroscope through the access sheath.  The stone was identified lateral to the guidewire.  It was in an interpolar calyx.  I used the [employer] lithoclast, mainly the harmonic/ultrasonic attachment, to fragment and aspirate the stone.  The pneumatic device was used to fragmented as well.  Fragments did rinse down into the UPJ area.  They were plucked out.  Additionally, some also went into the small tear lateral to  the renal pelvis.  They were also picked out of the adipose tissue in this region.  Careful inspection was then performed with the flexible cystoscope.  No further stones were seen in the interpolar  calyceal area on the pelvis, or the upper pole access site.  I could not negotiate the scope into the lower pole calyceal system.  Following inspection with the flexible scope, I replaced the rigid nephroscope.  A couple of small fragments were then removed with the graspers.  Following completion of all stone removal, I then negotiated a 24 centimeter by 6 [place] contour double-J stent distally through the ureter using fluoroscopic guidance.  Once the tip was seen within the bladder, the guidewire was removed.  A good curl was seen within the bladder.  Another good curl was seen within the renal pelvis area directly through the scope.  At this point, the scope was removed.  I then negotiated a 16 French Cook pigtail catheter over top of the working guidewire.  The catheter was advanced fluoroscopically up to the area of the renal pelvis.  This point, the nephrostomy sheath was backed out and cut from around the pigtail catheter.  I then removed the guidewire.  At this point, a nephrogram was performed using Omnipaque.  This revealed a moderate degree of extravasation, but the upper pole calyceal system filled, and it appeared that the pigtail catheter was in adequate position.  The pigtail catheter was then curled with the string pulled taut.  Again, Omnipaque was used to inject revealing adequate positioning with contrast filling the pelvis as well as the proximal ureter.  The safety wire was then removed.  The skin was then reapproximated using 2-0 silk placed in vertical mattress fashion 2.  A separate drain stitch was placed.  Then holding the pigtail catheter in place.  Dry sterile dressing was placed.  His drain was placed to direct bag drainage.  The patient was then awakened.  He was taken to the PACU in stable condition.  He tolerated procedure well.

## 2016-11-18 NOTE — Transfer of Care (Signed)
Immediate Anesthesia Transfer of Care Note  Patient: Howard Cabrera  Procedure(s) Performed: Procedure(s): NEPHROLITHOTOMY PERCUTANEOUS (Left) HOLMIUM LASER APPLICATION (Left)  Patient Location: PACU  Anesthesia Type:General  Level of Consciousness: sedated  Airway & Oxygen Therapy: Patient Spontanous Breathing and Patient connected to face mask oxygen  Post-op Assessment: Report given to RN and Post -op Vital signs reviewed and stable  Post vital signs: Reviewed and stable  Last Vitals:  Vitals:   11/18/16 0738  BP: (!) 162/82  Pulse: 73  Resp: 18  Temp: 36.5 C    Last Pain:  Vitals:   11/18/16 0738  TempSrc: Oral         Complications: No apparent anesthesia complications

## 2016-11-18 NOTE — Discharge Instructions (Signed)
Percutaneous Nephrostomy, Care After °This sheet gives you information about how to care for yourself after your procedure. Your health care provider may also give you more specific instructions. If you have problems or questions, contact your health care provider. °What can I expect after the procedure? °After the procedure, it is common to have: °· Some soreness where the nephrostomy tube was inserted (tube insertion site). °· Blood-tinged drainage from the nephrostomy tube for the first 24 hours. °Follow these instructions at home: °Activity  °· Return to your normal activities as told by your health care provider. Ask your health care provider what activities are safe for you. °· Avoid activities that may cause the nephrostomy tubing to bend. °· Do not take baths, swim, or use a hot tub until your health care provider approves. Ask your health care provider if you can take showers. Cover the nephrostomy tube dressing with a watertight covering when you take a shower. °· Do not drive for 24 hours if you were given a medicine to help you relax (sedative). °Care of the tube insertion site  °· Follow instructions from your health care provider about how to take care of your tube insertion site. Make sure you: °¨ Wash your hands with soap and water before you change your bandage (dressing). If soap and water are not available, use hand sanitizer. °¨ Change your dressing as told by your health care provider. Be careful not to pull on the tube while removing the dressing. °¨ When you change the dressing, wash the skin around the tube, rinse well, and pat the skin dry. °· Check the tube insertion area every day for signs of infection. Check for: °¨ More redness, swelling, or pain. °¨ More fluid or blood. °¨ Warmth. °¨ Pus or a bad smell. °Care of the nephrostomy tube and drainage bag  °· Always keep the tubing, the leg bag, or the bedside drainage bags below the level of the kidney so that your urine drains  freely. °· When connecting your nephrostomy tube to a drainage bag, make sure that there are no kinks in the tubing and that your urine is draining freely. You may want to use an elastic bandage to wrap any exposed tubing that goes from the nephrostomy tube to any of the connecting tubes. °· At night, you may want to connect your nephrostomy tube or the leg bag to a larger bedside drainage bag. °· Follow instructions from your health care provider about how to empty or change the drainage bag. °· Empty the drainage bag when it becomes ? full. °· Replace the drainage bag and any extension tubing that is connected to your nephrostomy tube every 3 weeks or as often as told by your health care provider. Your health care provider will explain how to change the drainage bag and extension tubing. °General instructions  °· Take over-the-counter and prescription medicines only as told by your health care provider. °· Keep all follow-up visits as told by your health care provider. This is important. °Contact a health care provider if: °· You have problems with any of the valves or tubing. °· You have persistent pain or soreness in your back. °· You have more redness, swelling, or pain around your tube insertion site. °· You have more fluid or blood coming from your tube insertion site. °· Your tube insertion site feels warm to the touch. °· You have pus or a bad smell coming from your tube insertion site. °· You have increased urine output   or you feel burning when urinating. °Get help right away if: °· You have pain in your abdomen during the first week. °· You have chest pain or have trouble breathing. °· You have a new appearance of blood in your urine. °· You have a fever or chills. °· You have back pain that is not relieved by your medicine. °· You have decreased urine output. °· Your nephrostomy tube comes out. °This information is not intended to replace advice given to you by your health care provider. Make sure you  discuss any questions you have with your health care provider. °Document Released: 06/17/2004 Document Revised: 08/06/2016 Document Reviewed: 08/06/2016 °Elsevier Interactive Patient Education © 2017 [employer] ° °Moderate Conscious Sedation, Adult, Care After °These instructions provide you with information about caring for yourself after your procedure. Your health care provider may also give you more specific instructions. Your treatment has been planned according to current medical practices, but problems sometimes occur. Call your health care provider if you have any problems or questions after your procedure. °What can I expect after the procedure? °After your procedure, it is common: °· To feel sleepy for several hours. °· To feel clumsy and have poor balance for several hours. °· To have poor judgment for several hours. °· To vomit if you eat too soon. °Follow these instructions at home: °For at least 24 hours after the procedure:  ° °· Do not: °¨ Participate in activities where you could fall or become injured. °¨ Drive. °¨ Use heavy machinery. °¨ Drink alcohol. °¨ Take sleeping pills or medicines that cause drowsiness. °¨ Make important decisions or sign legal documents. °¨ Take care of children on your own. °· Rest. °Eating and drinking  °· Follow the diet recommended by your health care provider. °· If you vomit: °¨ Drink water, juice, or soup when you can drink without vomiting. °¨ Make sure you have little or no nausea before eating solid foods. °General instructions  °· Have a responsible adult stay with you until you are awake and alert. °· Take over-the-counter and prescription medicines only as told by your health care provider. °· If you smoke, do not smoke without supervision. °· Keep all follow-up visits as told by your health care provider. This is important. °Contact a health care provider if: °· You keep feeling nauseous or you keep vomiting. °· You feel light-headed. °· You develop a  rash. °· You have a fever. °Get help right away if: °· You have trouble breathing. °This information is not intended to replace advice given to you by your health care provider. Make sure you discuss any questions you have with your health care provider. °Document Released: 08/15/2013 Document Revised: 03/29/2016 Document Reviewed: 02/14/2016 °Elsevier Interactive Patient Education © 2017 Elsevier Inc. ° °

## 2016-11-18 NOTE — Procedures (Signed)
Interventional Radiology Procedure Note  Procedure:  Left percutaneous nephroureteral catheter placement  Complications: None  Estimated Blood Loss: 20 mL  Findings:  Initial access at level of lower pole calculi.  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 Separate mid/upper pole access above staghorn with passage of 5 Fr catheter down ureter into bladder. Discussed with Dr. Retta Dionesahlstedt.  Jodi MarbleGlenn T. Fredia SorrowYamagata, M.D Pager:  [phone]

## 2016-11-19 DIAGNOSIS — N200 Calculus of kidney: Secondary | ICD-10-CM | POA: Diagnosis not present

## 2016-11-19 LAB — HEMOGLOBIN AND HEMATOCRIT, BLOOD
HEMATOCRIT: 40.9 % (ref 39.0–52.0)
HEMOGLOBIN: 14.7 g/dL (ref 13.0–17.0)

## 2016-11-19 MED ORDER — OXYCODONE HCL 5 MG PO TABS: 5.0000 mg | ORAL_TABLET | ORAL | 0 refills | Status: AC | PRN

## 2016-11-19 NOTE — Progress Notes (Signed)
1 Day Post-Op Subjective: Patient reports feeling OK w/o N/V  Objective: Vital signs in last 24 hours: Temp:  [97.2 F (36.2 C)-98.6 F (37 C)] 98.4 F (36.9 C) (01/12 0532) Pulse Rate:  [53-72] 72 (01/12 0532) Resp:  [11-25] 18 (01/11 2155) BP: (93-147)/(54-99) 137/81 (01/12 0532) SpO2:  [92 %-100 %] 92 % (01/12 0532) Weight:  [74.8 kg (165 lb)] 74.8 kg (165 lb) (01/11 0824)  Intake/Output from previous day: 01/11 0701 - 01/12 0700 In: 2434.2 [P.O.:360; I.V.:2074.2] Out: 1150 [Urine:1100; Blood:50] Intake/Output this shift: No intake/output data recorded.  Physical Exam:  Constitutional: Vital signs reviewed. WD WN in NAD   Eyes: PERRL, No scleral icterus.   Cardiovascular: RRR Pulmonary/Chest: Normal effort Abdominal: Soft. Non-tender, non-distended, bowel sounds are normal, no masses, organomegaly, or guarding present. Dressing saturated   Lab Results:  Recent Labs  11/16/16 1343 11/19/16 0511  HGB 17.3* 14.7  HCT 48.6 40.9   BMET  Recent Labs  11/16/16 1343  NA 139  K 3.5  CL 104  CO2 27  GLUCOSE 100*  BUN 14  CREATININE 1.17  CALCIUM 9.3    Recent Labs  11/16/16 1400  INR 1.00   No results for input(s): LABURIN in the last 72 hours. No results found for this or any previous visit.  Studies/Results: Dg C-arm 61-120 Min-no Report  Result Date: 11/18/2016 There is no Radiologist interpretation  for this exam.  Ir Ureteral Stent Left New Access W/o Sep Nephrostomy Cath  Result Date: 11/18/2016 INDICATION: Left-sided central staghorn renal calculus and additional lower pole calculi. EXAM: IR URETERAL STENT LEFT NEW ACCESS W/O SEP NEPHROSTOMY CATH COMPARISON:  Prior CT of the abdomen at [facility] on 08/19/2016. MEDICATIONS: 400 mg IV Cipro; The antibiotic was administered in an appropriate time frame prior to skin puncture. ANESTHESIA/SEDATION: Fentanyl 100 mcg IV; Versed 2.0 mg IV Moderate Sedation Time:  56 minutes. The patient  was continuously monitored during the procedure by the interventional radiology nurse under my direct supervision. CONTRAST:  60 mL ISOVUE-300 IOPAMIDOL (ISOVUE-300) INJECTION 61% - administered into the collecting system(s) FLUOROSCOPY TIME:  Fluoroscopy Time: 18 minutes and 12 seconds. (353 mGy). COMPLICATIONS: None immediate. PROCEDURE: Informed written consent was obtained from the patient after a thorough discussion of the procedural risks, benefits and alternatives. All questions were addressed. Maximal Sterile Barrier Technique was utilized including caps, mask, sterile gowns, sterile gloves, sterile drape, hand hygiene and skin antiseptic. A timeout was performed prior to the initiation of the procedure. The patient was placed in a prone position. The left flank region was prepped with chlorhexidine. Ultrasound was used to localize the left kidney. Under direct ultrasound guidance, a 21 gauge needle was advanced into the upper pole collecting system. Diluted contrast material was injected via the needle to opacified the collecting system. Separate lower pole access was then performed with a second 21 gauge needle under fluoroscopic guidance. Contrast injection was performed at the level of a lower pole calyx. After advancing a guidewire into the collecting system, a transitional dilator was placed. A 5 French catheter was advanced into the lower pole collecting system. Attempt was made to advance the catheter over various guidewires into the central collecting system. Ultimately, this access was abandoned. Additional access was then performed at the level of the mid left kidney under fluoroscopic guidance. After puncture with a 21 gauge needle, a guidewire was advanced into the upper pole collecting system. A transitional dilator was placed. A guidewire was advanced into the  renal pelvis and ureter. A 5 French catheter was then advanced over a wire through the ureter and to the level of the bladder. This  catheter was capped and secured at the skin with a retention suture. FINDINGS: After initial puncture of the upper pole collecting system, contrast injection demonstrates flow of contrast into the renal pelvis and ureter with no opacification of the lower pole collecting system. Central staghorn calculus extends into upper and lower infundibula as well as the lateral renal pelvis. A cluster of smaller calculi was targeted in the lower pole collecting system. After needle puncture, contrast injection shows relative obstruction of a lower pole calyx containing these calculi. A guidewire could not be negotiated beyond the inferior aspect of the staghorn calculus due to infundibular stenosis and contrast extravasation was encountered during the procedure. Access was able to be accomplished at the level of the upper to mid kidney just above the twelfth rib. After access of the collecting system superior to the staghorn calculus, a guidewire was able to be advanced into the renal pelvis and down the ureter. A 5 French catheter was advanced to the level of the bladder. IMPRESSION: Percutaneous access of the left kidney with eventual 5 French ureteral catheter placement to the level of the bladder to be utilized for percutaneous nephrolithotomy. 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 Electronically Signed   By: Irish LackGlenn  Yamagata M.D.   On: 11/18/2016 14:14    Assessment/Plan:   POD 1 left PCNL. Doing well. Will need perc tube left in for a few days due to renal pelvic trauma. I'll check on later today re: possible d/c   LOS: 0 days   Marcine MatarDAHLSTEDT, Felma Pfefferle M 11/19/2016, 7:39 AM

## 2016-11-19 NOTE — Anesthesia Postprocedure Evaluation (Signed)
Anesthesia Post Note  Patient: Howard Cabrera  Procedure(s) Performed: Procedure(s) (LRB): NEPHROLITHOTOMY PERCUTANEOUS (Left)  Patient location during evaluation: PACU Anesthesia Type: General Level of consciousness: sedated Pain management: satisfactory to patient Vital Signs Assessment: post-procedure vital signs reviewed and stable Respiratory status: spontaneous breathing Cardiovascular status: stable Anesthetic complications: no       Last Vitals:  Vitals:   11/19/16 0532 11/19/16 1325  BP: 137/81 (!) 132/59  Pulse: 72 79  Resp:  20  Temp: 36.9 C 36.4 C    Last Pain:  Vitals:   11/19/16 1325  TempSrc: Oral  PainSc:                  [name],[name]

## 2016-11-19 NOTE — Discharge Instructions (Signed)

## 2016-11-19 NOTE — Progress Notes (Signed)
Patient's left nephrotomy dressing found to be leaking this morning under Tegaderm.  Dressing reinforced with gauze and Abd pads.  Will continue to closely monitor patient.

## 2016-11-22 ENCOUNTER — Other Ambulatory Visit: Payer: Self-pay | Admitting: Urology

## 2016-11-22 DIAGNOSIS — N200 Calculus of kidney: Secondary | ICD-10-CM

## 2016-11-25 ENCOUNTER — Encounter (HOSPITAL_COMMUNITY): Payer: Self-pay | Admitting: Interventional Radiology

## 2016-11-25 ENCOUNTER — Ambulatory Visit (HOSPITAL_COMMUNITY)
Admission: RE | Admit: 2016-11-25 | Discharge: 2016-11-25 | Disposition: A | Discharge status: Home / Self Care | Payer: Medicare Other | Source: Ambulatory Visit | Attending: Urology | Admitting: Urology

## 2016-11-25 ENCOUNTER — Other Ambulatory Visit: Payer: Self-pay | Admitting: Urology

## 2016-11-25 DIAGNOSIS — Z87442 Personal history of urinary calculi: Secondary | ICD-10-CM | POA: Diagnosis not present

## 2016-11-25 DIAGNOSIS — Z436 Encounter for attention to other artificial openings of urinary tract: Secondary | ICD-10-CM | POA: Diagnosis present

## 2016-11-25 DIAGNOSIS — N200 Calculus of kidney: Secondary | ICD-10-CM

## 2016-11-25 HISTORY — PX: IR GENERIC HISTORICAL: IMG1180011

## 2016-11-25 MED ORDER — IOPAMIDOL (ISOVUE-300) INJECTION 61%
10.0000 mL | Freq: Once | INTRAVENOUS | Status: AC | PRN
  Administered 2016-11-25: 10 mL

## 2016-11-25 MED ORDER — IOPAMIDOL (ISOVUE-300) INJECTION 61%
INTRAVENOUS | Status: AC
  Administered 2016-11-25: 10 mL
  Filled 2016-11-25: qty 50

## 2016-12-15 NOTE — Discharge Summary (Signed)
Patient ID: Howard Cabrera MRN: [id number] DOB/AGE: 02/26/1932 83 y.o.  Admit date: 11/18/2016 Discharge date: 12/15/2016  Primary Care Physician:  Christena Flake, MD  Discharge Diagnoses:  Renal calculus  Consults:  None   Discharge Medications: Allergies as of 11/19/2016      Reactions   Penicillins Rash   Has patient had a PCN reaction causing immediate rash, facial/tongue/throat swelling, SOB or lightheadedness with hypotension: Yes Has patient had a PCN reaction causing severe rash involving mucus membranes or skin necrosis: No Has patient had a PCN reaction that required hospitalization No Has patient had a PCN reaction occurring within the last 10 years: No If all of the above answers are "NO", then may proceed with Cephalosporin use.      Medication List    STOP taking these medications   aspirin EC 81 MG tablet     TAKE these medications   acetaminophen 650 MG CR tablet Commonly known as:  TYLENOL Take 650 mg by mouth every 8 (eight) hours as needed for pain.   diltiazem 240 MG 24 hr capsule Commonly known as:  DILACOR XR Take 240 mg by mouth daily.   diphenhydrAMINE 25 mg capsule Commonly known as:  BENADRYL Take 25 mg by mouth daily as needed for itching or allergies.   hydrochlorothiazide 25 MG tablet Commonly known as:  HYDRODIURIL Take 12.5 mg by mouth daily.   oxyCODONE 5 MG immediate release tablet Commonly known as:  Oxy IR/ROXICODONE Take 1 tablet (5 mg total) by mouth every 4 (four) hours as needed for moderate pain.   simvastatin 40 MG tablet Commonly known as:  ZOCOR Take 40 mg by mouth every evening.   sulfamethoxazole-trimethoprim 800-160 MG tablet Commonly known as:  BACTRIM DS,SEPTRA DS Take 1 tablet by mouth 2 (two) times daily.   VITAMIN C PO Take 1 tablet by mouth daily.        Significant Diagnostic Studies:  Ir Nephro Tube Remov/fl  Result Date: 11/25/2016 INDICATION: History of left-sided nephrolithiasis, post  percutaneous nephrolithotomy procedure on 11/18/2016. Please perform left-sided antegrade nephrostogram prior to potential nephrostomy catheter removal. EXAM: IR NEPHROSTOGRAM EXISTING ACCESS LEFT; NEPHROSTOMY REMOVAL COMPARISON:  Ultrasound and fluoroscopic guided nephrostomy catheter placement - 11/18/2016; CT of the chest, abdomen and pelvis -08/19/2026 MEDICATIONS: None ANESTHESIA/SEDATION: None CONTRAST:  10 cc Isovue-300 - administered into the left renal collecting system FLUOROSCOPY TIME:  Fluoroscopy Time: 42 seconds (5.2 MGy). COMPLICATIONS: None immediate. PROCEDURE: Informed written consent was obtained from the patient after a thorough discussion of the procedural risks, benefits and alternatives. All questions were addressed. Maximal Sterile Barrier Technique was utilized including caps, mask, sterile gowns, sterile gloves, sterile drape, hand hygiene and skin antiseptic. A timeout was performed prior to the initiation of the procedure. The patient was positioned supine on the fluoroscopy table. Preprocedural spot fluoroscopic image was obtained of the left mid hemiabdomen in the existing left-sided nephrostomy catheter and double-J ureteral stent Contrast was injected via the left-sided percutaneous nephrostomy catheter. Images reviewed and the decision was made to remove the percutaneous nephrostomy catheter. The external portion of the nephrostomy was cut and the catheter was removed. Postprocedural fluoroscopic imaging was obtained. A dressing was placed. The patient tolerated procedure well without immediate postprocedural complication. FINDINGS: Preprocedural imaging demonstrates unchanged positioning of the left-sided percutaneous nephrostomy catheter as well as an a placement of a left-sided double-J ureteral stent. Interval apparent removal of previously identified left-sided inferior pole staghorn calculi. Contrast injection demonstrates passage of contrast through and around the  left-sided  double-J ureteral stent to the level of the urinary bladder. No evidence of significant pelvicaliectasis. Postprocedural imaging demonstrates successful removal of the left-sided nephrostomy catheter with unchanged positioning of the left-sided double-J ureteral stent. IMPRESSION: 1. Antegrade nephrostogram demonstrates brisk passage of contrast from the left renal collecting system in an around the left-sided double-J ureteral stent the level of the urinary bladder. 2. Successful fluoroscopic guided removal of left-sided nephrostomy catheter. Electronically Signed   By: Simonne ComeJohn  Watts M.D.   On: 11/25/2016 12:53   Ir Nephrostogram Left Thru Existing Access  Result Date: 11/25/2016 INDICATION: History of left-sided nephrolithiasis, post percutaneous nephrolithotomy procedure on 11/18/2016. Please perform left-sided antegrade nephrostogram prior to potential nephrostomy catheter removal. EXAM: IR NEPHROSTOGRAM EXISTING ACCESS LEFT; NEPHROSTOMY REMOVAL COMPARISON:  Ultrasound and fluoroscopic guided nephrostomy catheter placement - 11/18/2016; CT of the chest, abdomen and pelvis -08/19/2026 MEDICATIONS: None ANESTHESIA/SEDATION: None CONTRAST:  10 cc Isovue-300 - administered into the left renal collecting system FLUOROSCOPY TIME:  Fluoroscopy Time: 42 seconds (5.2 MGy). COMPLICATIONS: None immediate. PROCEDURE: Informed written consent was obtained from the patient after a thorough discussion of the procedural risks, benefits and alternatives. All questions were addressed. Maximal Sterile Barrier Technique was utilized including caps, mask, sterile gowns, sterile gloves, sterile drape, hand hygiene and skin antiseptic. A timeout was performed prior to the initiation of the procedure. The patient was positioned supine on the fluoroscopy table. Preprocedural spot fluoroscopic image was obtained of the left mid hemiabdomen in the existing left-sided nephrostomy catheter and double-J ureteral stent Contrast was injected  via the left-sided percutaneous nephrostomy catheter. Images reviewed and the decision was made to remove the percutaneous nephrostomy catheter. The external portion of the nephrostomy was cut and the catheter was removed. Postprocedural fluoroscopic imaging was obtained. A dressing was placed. The patient tolerated procedure well without immediate postprocedural complication. FINDINGS: Preprocedural imaging demonstrates unchanged positioning of the left-sided percutaneous nephrostomy catheter as well as an a placement of a left-sided double-J ureteral stent. Interval apparent removal of previously identified left-sided inferior pole staghorn calculi. Contrast injection demonstrates passage of contrast through and around the left-sided double-J ureteral stent to the level of the urinary bladder. No evidence of significant pelvicaliectasis. Postprocedural imaging demonstrates successful removal of the left-sided nephrostomy catheter with unchanged positioning of the left-sided double-J ureteral stent. IMPRESSION: 1. Antegrade nephrostogram demonstrates brisk passage of contrast from the left renal collecting system in an around the left-sided double-J ureteral stent the level of the urinary bladder. 2. Successful fluoroscopic guided removal of left-sided nephrostomy catheter. Electronically Signed   By: Simonne ComeJohn  Watts M.D.   On: 11/25/2016 12:53    Brief H and P: For complete details please refer to admission H and P, but in brief the patient was admitted for surgical management of a large renal calculus. Hospital Course: The patient was admitted to the hospital following his percutaneous nephrolithotomy.  He tolerated the procedure well.  Postoperative course was uncomplicated.  His nephrostomy tube was removed on postoperative day #1, he voided and eight adequately.  He was discharged on postoperative day #1. Active Problems:   Staghorn calculus   Day of Discharge BP (!) 132/59 (BP Location: Right Arm)    Pulse 79   Temp 97.6 F (36.4 C) (Oral)   Resp 20   Ht 5\' 8"  (1.727 m)   Wt 74.8 kg (165 lb)   SpO2 94%   BMI 25.09 kg/m   No results found for this or any previous visit (from the past  24 hour(s)).  Physical Exam: General: Alert and awake oriented x3 not in any acute distress. HEENT: anicteric sclera, pupils reactive to light and accommodation CVS: S1-S2 clear no murmur rubs or gallops Chest: clear to auscultation bilaterally, no wheezing rales or rhonchi Abdomen: soft nontender, nondistended, normal bowel sounds, no organomegaly Extremities: no cyanosis, clubbing or edema noted bilaterally Neuro: Cranial nerves II-XII intact, no focal neurological deficits  Disposition:  Home  Diet:  No restrictions  Activity:  Gradually increased, discussed with patient   Disposition and Follow-up:    Follow-up in the office for suture removal  TESTS THAT NEED FOLLOW-UP  Eventual 24-hour urine collection  DISCHARGE FOLLOW-UP Follow-up Information    Aidric Endicott, Bertram Millard, MD Follow up.   Specialty:  Urology Why:  we will call you to set up appt for tube xr Contact information: [address] [place] [address] [phone]           Time spent on Discharge:  15 minutes  Signed: Chelsea Aus 12/15/2016, 2:42 PM

## 2018-09-09 IMAGING — XA IR NEPHROSTOGRAM EXISTING ACCESS LEFT
4 of 5 series · 15 of 18 positions shown · non-contrast
Comparison: Ultrasound and fluoroscopic guided nephrostomy catheter
placement - 11/18/2016;

INDICATION: History of left-sided nephrolithiasis, post percutaneous
nephrolithotomy procedure on 11/18/2016. Please perform left-sided
antegrade nephrostogram prior to potential nephrostomy catheter
removal.

EXAM:
IR NEPHROSTOGRAM EXISTING ACCESS LEFT; NEPHROSTOMY REMOVAL

[Series 3: fl - angio · 3 of 107 frames shown (1 of 3)]
[frame 6/107]
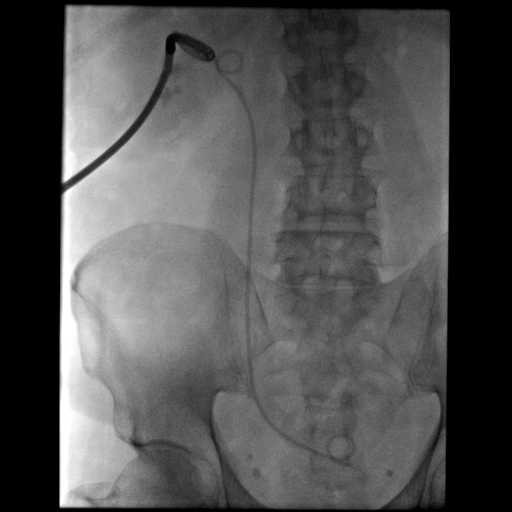
[frame 17/107]
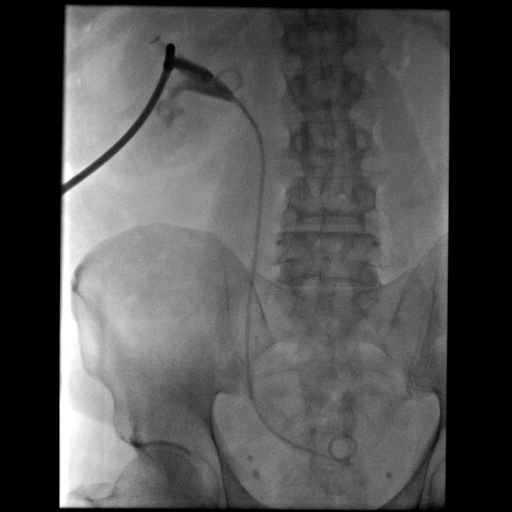
[frame 91/107]
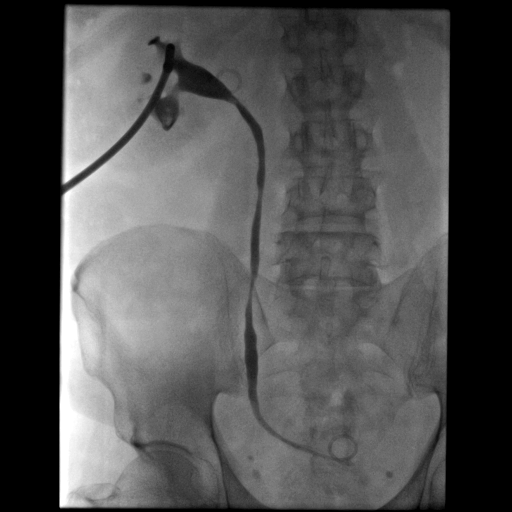

[Series 4: fl - angio · 4 of 41 frames shown (2 of 3)]
[frame 5/41]
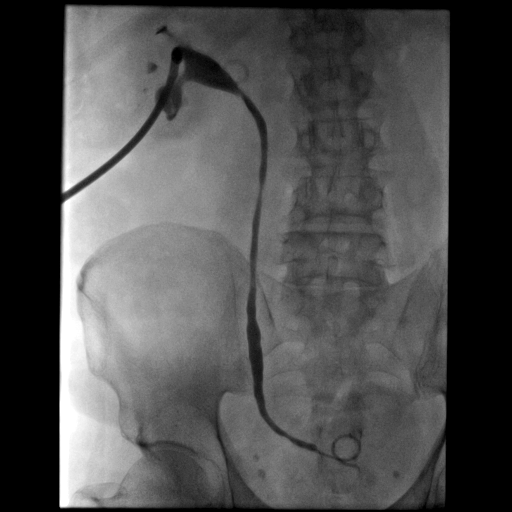
[frame 7/41]
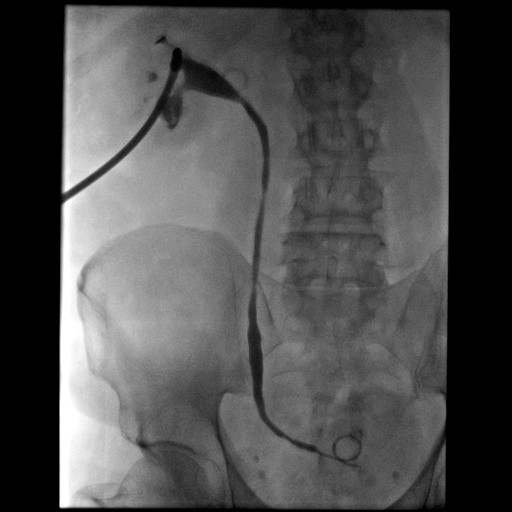
[frame 21/41]
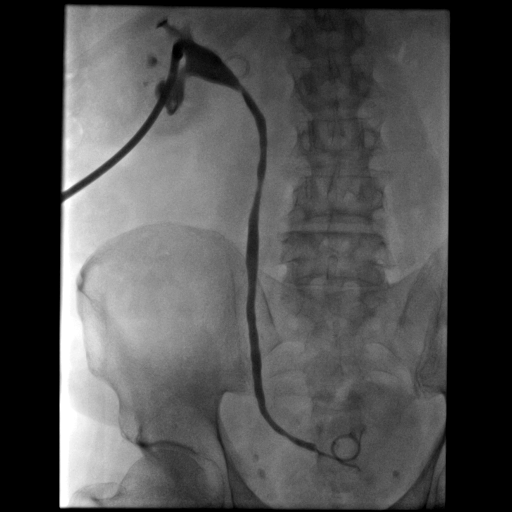
[frame 35/41]
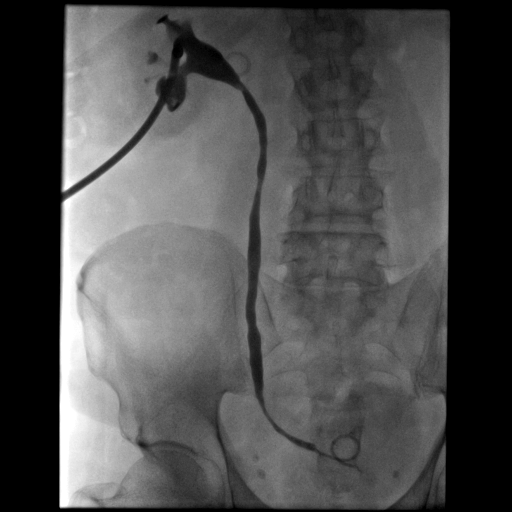

[Series 6: fl - angio · 4 of 64 frames shown (3 of 3)]
[frame 5/64]
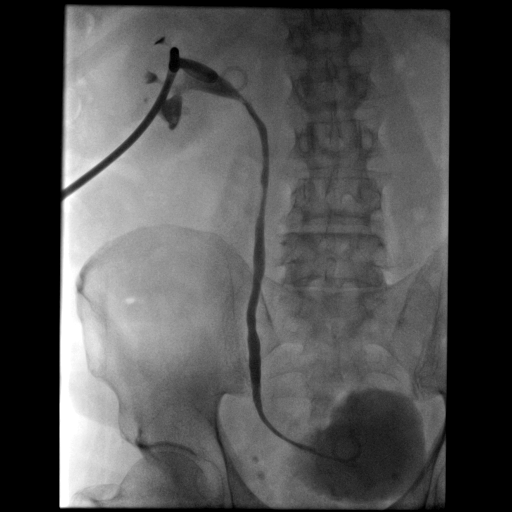
[frame 10/64]
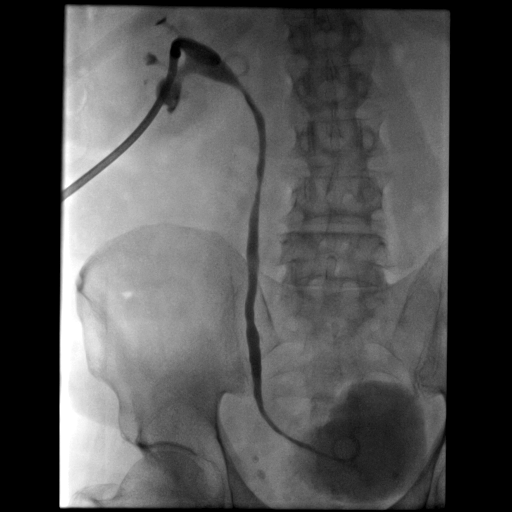
[frame 33/64]
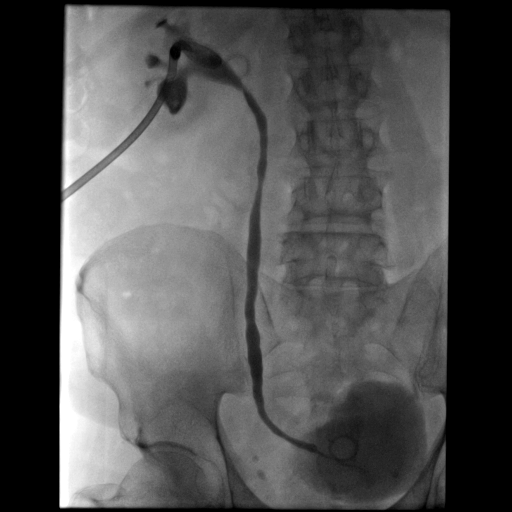
[frame 55/64]
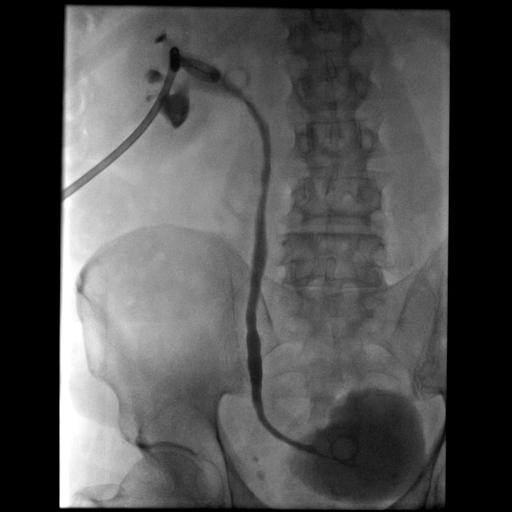

[Series 300: ir nephrostogram left thru existing acce · 4 of 5 slices shown]
[im 1/5]
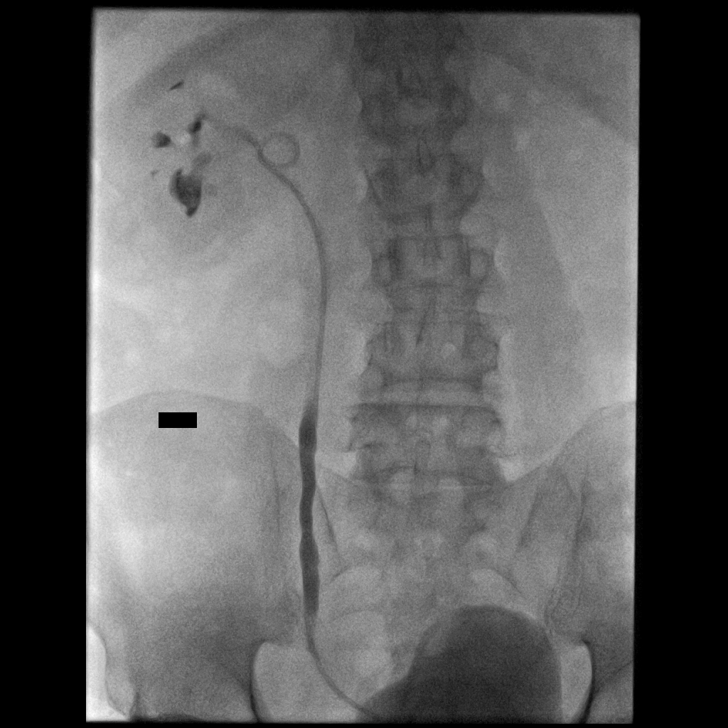
[im 3/5]
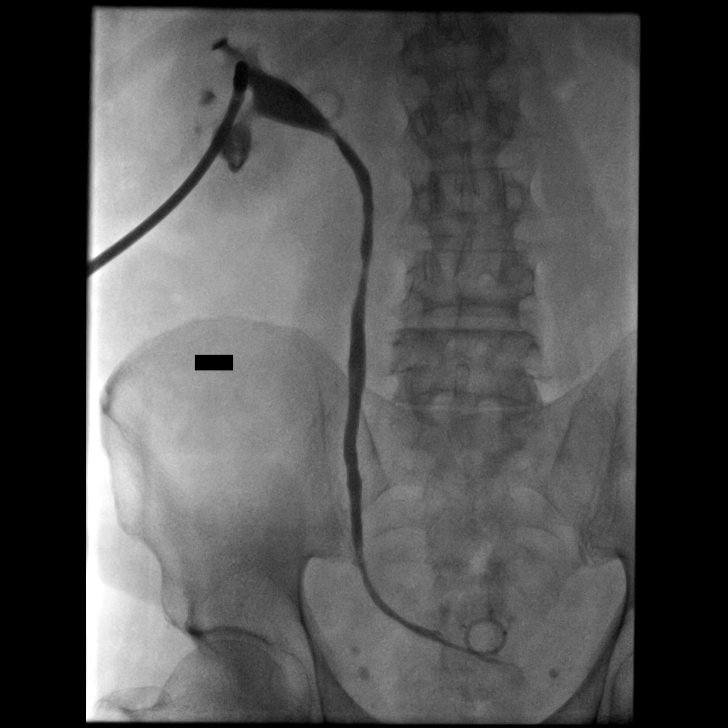
[im 4/5]
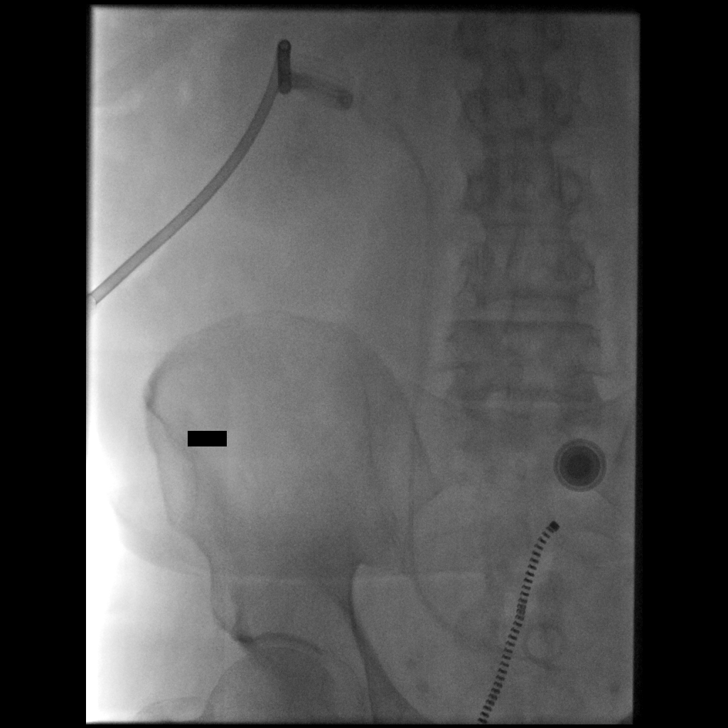
[im 5/5]
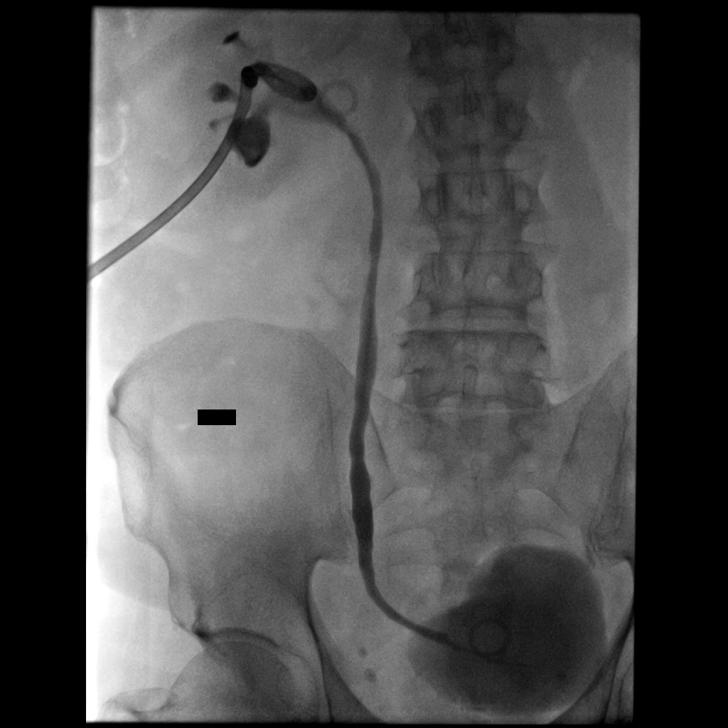

[15 of 18 positions shown; findings below may reference images not displayed]

CT of the chest, abdomen and pelvis
-08/19/2026

MEDICATIONS:
None

ANESTHESIA/SEDATION:
None

CONTRAST:  10 cc Csovue-QPP - administered into the left renal
collecting system

FLUOROSCOPY TIME:  Fluoroscopy Time: 42 seconds (5.2 MGy).

COMPLICATIONS:
None immediate.

PROCEDURE:
Informed written consent was obtained from the patient after a
thorough discussion of the procedural risks, benefits and
alternatives. All questions were addressed. Maximal Sterile Barrier
Technique was utilized including caps, mask, sterile gowns, sterile
gloves, sterile drape, hand hygiene and skin antiseptic. A timeout
was performed prior to the initiation of the procedure.

The patient was positioned supine on the fluoroscopy table.
Preprocedural spot fluoroscopic image was obtained of the left mid
hemiabdomen in the existing left-sided nephrostomy catheter and
double-J ureteral stent

Contrast was injected via the left-sided percutaneous nephrostomy
catheter. Images reviewed and the decision was made to remove the
percutaneous nephrostomy catheter.

The external portion of the nephrostomy was cut and the catheter was
removed. Postprocedural fluoroscopic imaging was obtained.

A dressing was placed. The patient tolerated procedure well without
immediate postprocedural complication.
FINDINGS: Preprocedural imaging demonstrates unchanged positioning of the
left-sided percutaneous nephrostomy catheter as well as an a
placement of a left-sided double-J ureteral stent.

Interval apparent removal of previously identified left-sided
inferior pole staghorn calculi.

Contrast injection demonstrates passage of contrast through and
around the left-sided double-J ureteral stent to the level of the
urinary bladder. No evidence of significant pelvicaliectasis.

Postprocedural imaging demonstrates successful removal of the
left-sided nephrostomy catheter with unchanged positioning of the
left-sided double-J ureteral stent.
IMPRESSION: 1. Antegrade nephrostogram demonstrates brisk passage of contrast
from the left renal collecting system in an around the left-sided
double-J ureteral stent the level of the urinary bladder.
2. Successful fluoroscopic guided removal of left-sided nephrostomy
catheter.

## 2023-01-07 DEATH — deceased
